# Patient Record
Sex: Male | Born: 1970
Health system: Southern US, Community
[De-identification: ages and names within clinical notes are randomized; demographics above are authoritative.]

---

## 2007-07-06 ENCOUNTER — Ambulatory Visit (HOSPITAL_COMMUNITY): Admission: RE | Admit: 2007-07-06 | Discharge: 2007-07-06 | Payer: Self-pay | Admitting: Family Medicine

## 2007-08-24 ENCOUNTER — Ambulatory Visit (HOSPITAL_COMMUNITY): Admission: RE | Admit: 2007-08-24 | Discharge: 2007-08-24 | Payer: Self-pay | Admitting: Family Medicine

## 2009-02-21 ENCOUNTER — Ambulatory Visit (HOSPITAL_COMMUNITY): Admission: RE | Admit: 2009-02-21 | Discharge: 2009-02-21 | Payer: Self-pay | Admitting: Family Medicine

## 2010-03-05 ENCOUNTER — Ambulatory Visit (HOSPITAL_COMMUNITY): Admission: RE | Admit: 2010-03-05 | Discharge: 2010-03-05 | Payer: Self-pay | Admitting: Family Medicine

## 2017-04-08 DIAGNOSIS — Z0000 Encounter for general adult medical examination without abnormal findings: Secondary | ICD-10-CM | POA: Diagnosis not present

## 2017-04-08 DIAGNOSIS — B356 Tinea cruris: Secondary | ICD-10-CM | POA: Diagnosis not present

## 2017-04-08 DIAGNOSIS — Z1389 Encounter for screening for other disorder: Secondary | ICD-10-CM | POA: Diagnosis not present

## 2018-02-08 DIAGNOSIS — G629 Polyneuropathy, unspecified: Secondary | ICD-10-CM | POA: Diagnosis not present

## 2018-02-08 DIAGNOSIS — Z1389 Encounter for screening for other disorder: Secondary | ICD-10-CM | POA: Diagnosis not present

## 2018-02-08 DIAGNOSIS — R202 Paresthesia of skin: Secondary | ICD-10-CM | POA: Diagnosis not present

## 2018-02-08 DIAGNOSIS — E6609 Other obesity due to excess calories: Secondary | ICD-10-CM | POA: Diagnosis not present

## 2018-02-08 DIAGNOSIS — M5481 Occipital neuralgia: Secondary | ICD-10-CM | POA: Diagnosis not present

## 2018-03-05 DIAGNOSIS — R202 Paresthesia of skin: Secondary | ICD-10-CM | POA: Diagnosis not present

## 2018-03-05 DIAGNOSIS — R0789 Other chest pain: Secondary | ICD-10-CM | POA: Diagnosis not present

## 2018-03-05 DIAGNOSIS — E6609 Other obesity due to excess calories: Secondary | ICD-10-CM | POA: Diagnosis not present

## 2018-03-24 ENCOUNTER — Ambulatory Visit (HOSPITAL_COMMUNITY)
Admission: RE | Admit: 2018-03-24 | Discharge: 2018-03-24 | Disposition: A | Discharge status: Home / Self Care | Payer: 59 | Source: Ambulatory Visit | Attending: Neurology | Admitting: Neurology

## 2018-03-24 ENCOUNTER — Other Ambulatory Visit (HOSPITAL_COMMUNITY): Payer: Self-pay | Admitting: Neurology

## 2018-03-24 DIAGNOSIS — M542 Cervicalgia: Secondary | ICD-10-CM | POA: Diagnosis present

## 2018-03-24 DIAGNOSIS — M2578 Osteophyte, vertebrae: Secondary | ICD-10-CM | POA: Insufficient documentation

## 2018-03-24 DIAGNOSIS — M50322 Other cervical disc degeneration at C5-C6 level: Secondary | ICD-10-CM | POA: Diagnosis not present

## 2018-03-24 DIAGNOSIS — G5603 Carpal tunnel syndrome, bilateral upper limbs: Secondary | ICD-10-CM | POA: Diagnosis not present

## 2018-03-24 DIAGNOSIS — G5622 Lesion of ulnar nerve, left upper limb: Secondary | ICD-10-CM | POA: Diagnosis not present

## 2018-04-01 ENCOUNTER — Other Ambulatory Visit (HOSPITAL_COMMUNITY): Payer: Self-pay | Admitting: Neurology

## 2018-04-01 DIAGNOSIS — M5412 Radiculopathy, cervical region: Secondary | ICD-10-CM | POA: Diagnosis not present

## 2018-04-01 DIAGNOSIS — G603 Idiopathic progressive neuropathy: Secondary | ICD-10-CM | POA: Diagnosis not present

## 2018-04-12 ENCOUNTER — Other Ambulatory Visit (HOSPITAL_COMMUNITY): Payer: Self-pay | Admitting: Neurology

## 2018-04-12 DIAGNOSIS — M5412 Radiculopathy, cervical region: Secondary | ICD-10-CM

## 2018-04-14 DIAGNOSIS — Z0000 Encounter for general adult medical examination without abnormal findings: Secondary | ICD-10-CM | POA: Diagnosis not present

## 2018-04-14 DIAGNOSIS — E6609 Other obesity due to excess calories: Secondary | ICD-10-CM | POA: Diagnosis not present

## 2018-04-14 DIAGNOSIS — Z1389 Encounter for screening for other disorder: Secondary | ICD-10-CM | POA: Diagnosis not present

## 2018-04-14 DIAGNOSIS — Z6831 Body mass index (BMI) 31.0-31.9, adult: Secondary | ICD-10-CM | POA: Diagnosis not present

## 2018-04-21 ENCOUNTER — Ambulatory Visit (HOSPITAL_COMMUNITY)
Admission: RE | Admit: 2018-04-21 | Discharge: 2018-04-21 | Disposition: A | Discharge status: Home / Self Care | Payer: 59 | Source: Ambulatory Visit | Attending: Neurology | Admitting: Neurology

## 2018-04-21 DIAGNOSIS — M47812 Spondylosis without myelopathy or radiculopathy, cervical region: Secondary | ICD-10-CM | POA: Diagnosis not present

## 2018-04-21 DIAGNOSIS — M50221 Other cervical disc displacement at C4-C5 level: Secondary | ICD-10-CM | POA: Diagnosis not present

## 2018-04-21 DIAGNOSIS — M5412 Radiculopathy, cervical region: Secondary | ICD-10-CM

## 2018-04-21 DIAGNOSIS — M542 Cervicalgia: Secondary | ICD-10-CM | POA: Diagnosis not present

## 2018-04-28 DIAGNOSIS — G5623 Lesion of ulnar nerve, bilateral upper limbs: Secondary | ICD-10-CM | POA: Diagnosis not present

## 2018-04-28 DIAGNOSIS — R208 Other disturbances of skin sensation: Secondary | ICD-10-CM | POA: Diagnosis not present

## 2018-04-28 DIAGNOSIS — M542 Cervicalgia: Secondary | ICD-10-CM | POA: Diagnosis not present

## 2018-10-29 DEATH — deceased

## 2018-12-19 IMAGING — DX DG CERVICAL SPINE COMPLETE 4+V
8 series · 8 of 8 positions shown · non-contrast
Comparison: No recent prior.

CLINICAL DATA: Neck pain.

EXAM:
CERVICAL SPINE - COMPLETE 4+ VIEW

[c-spine lat]
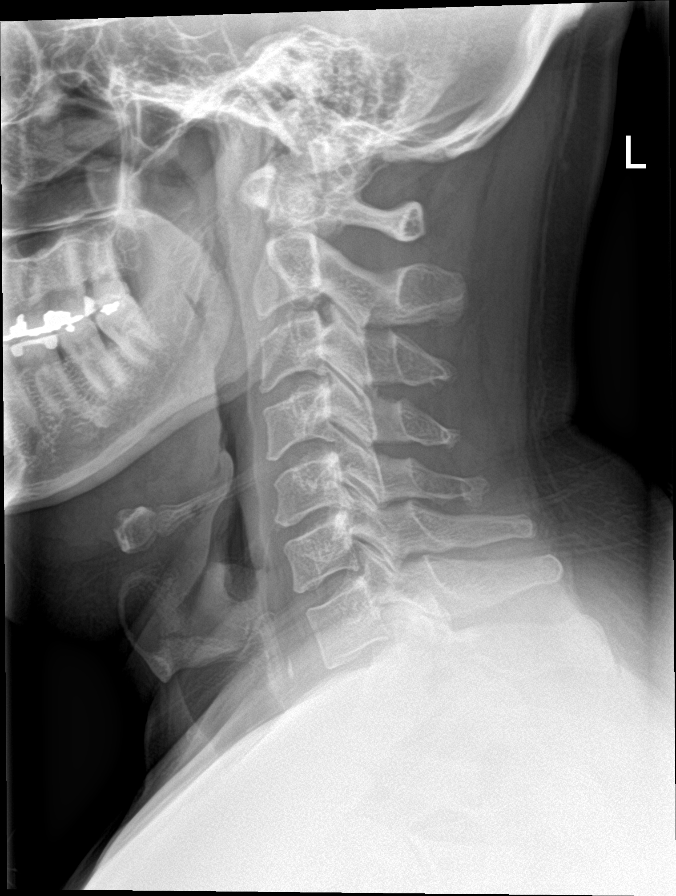

[c-spine obl (1 of 2)]
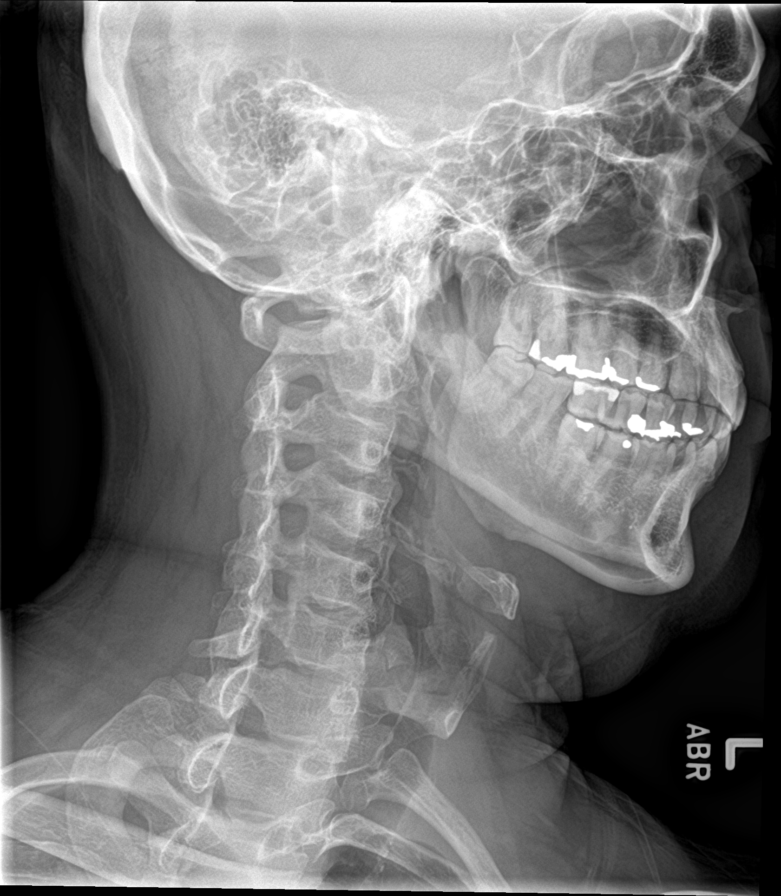

[c-spine obl (2 of 2)]
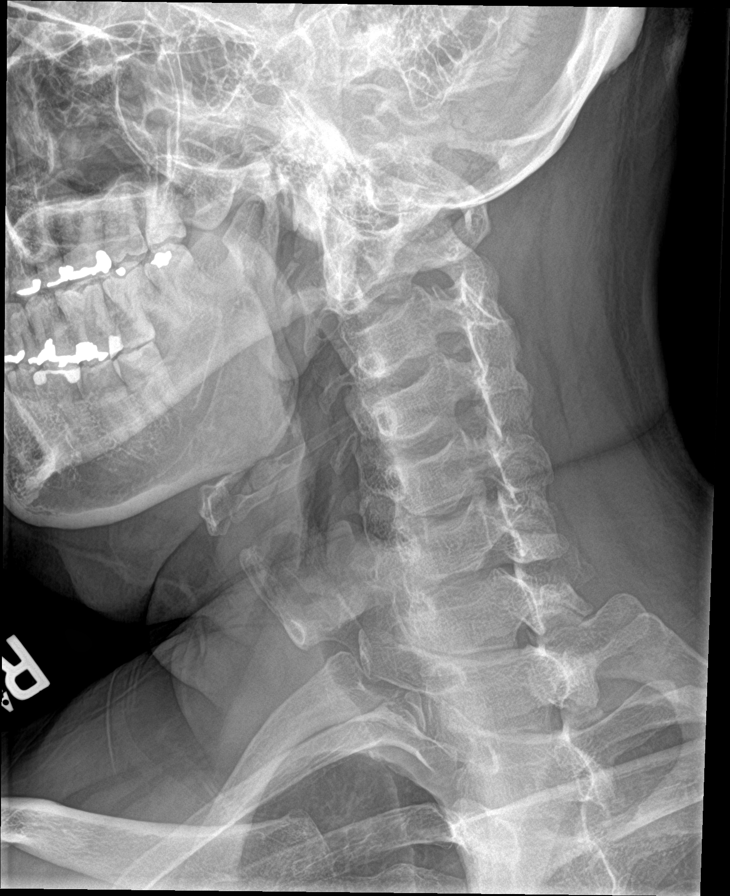

[c-spine ap (1 of 2)]
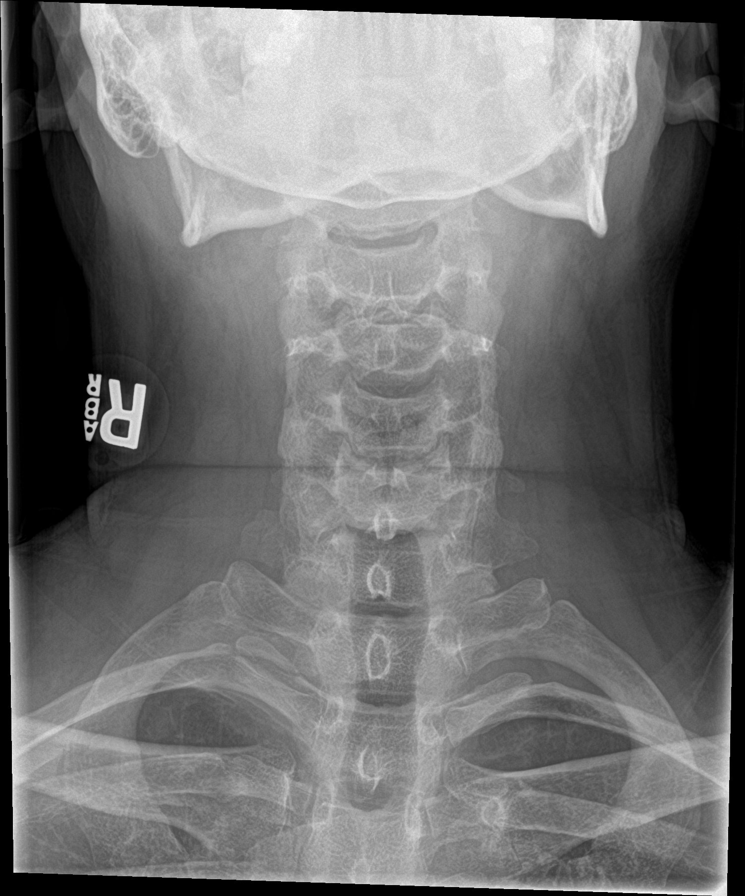

[c-spine open mouth (1 of 2)]
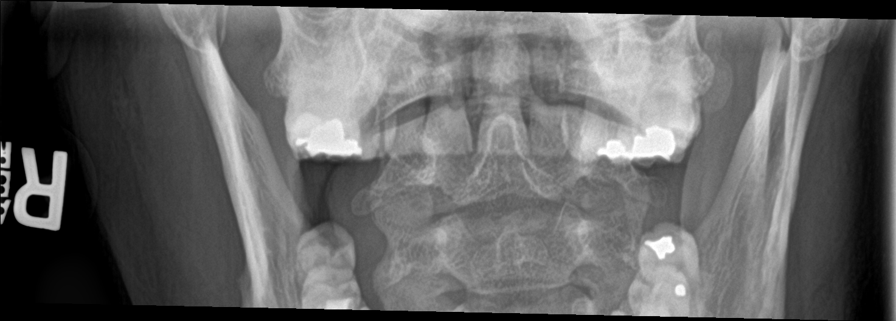

[c-spine swimmers trauma]
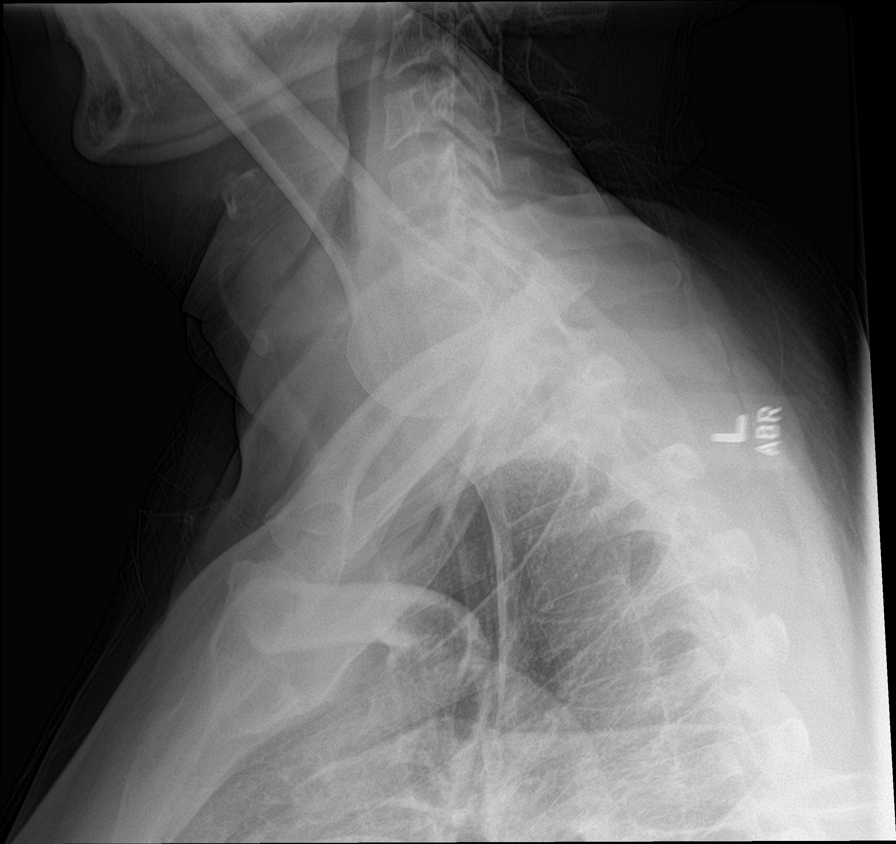

[c-spine open mouth (2 of 2)]
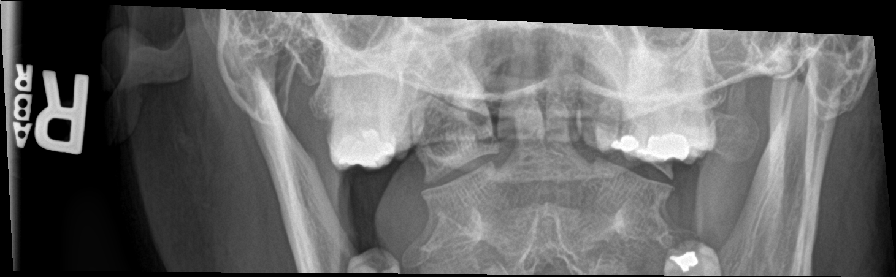

[c-spine ap (2 of 2)]
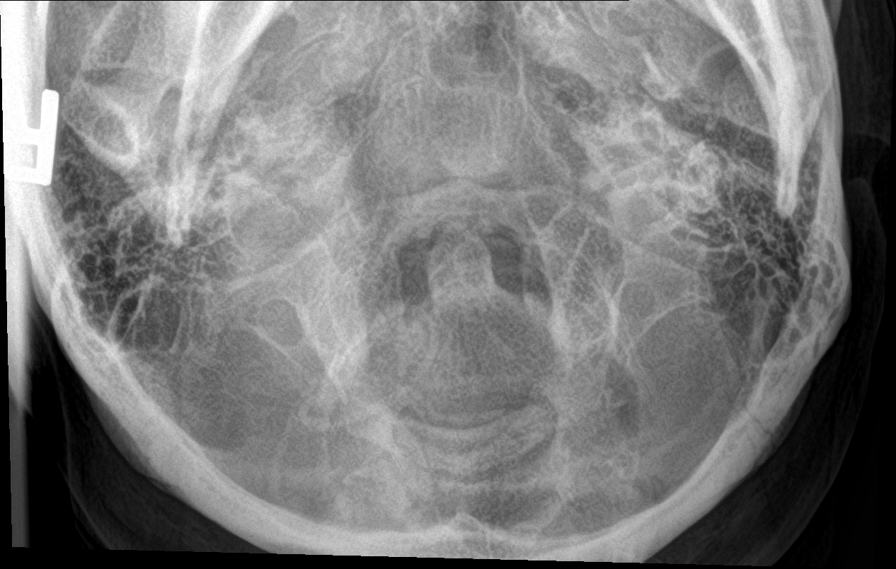

[8 of 8 positions shown; findings below may reference images not displayed]

FINDINGS: C5-C6 mild disc degeneration and endplate osteophyte formation. No
acute bony abnormality identified. No evidence of fracture or
dislocation. Mild bilateral neural foraminal narrowing noted at this
level.
IMPRESSION: C5-C6 mild disc degeneration and endplate osteophyte formation. Mild
bilateral neural foraminal narrowing noted at this level. No acute
abnormality identified.

## 2019-09-09 ENCOUNTER — Other Ambulatory Visit: Payer: Self-pay

## 2019-09-09 ENCOUNTER — Ambulatory Visit: Payer: Self-pay | Attending: Internal Medicine

## 2019-09-09 DIAGNOSIS — Z20822 Contact with and (suspected) exposure to covid-19: Secondary | ICD-10-CM | POA: Insufficient documentation

## 2019-09-10 LAB — NOVEL CORONAVIRUS, NAA: SARS-CoV-2, NAA: NOT DETECTED

## 2020-09-19 ENCOUNTER — Ambulatory Visit: Payer: Self-pay

## 2021-03-01 DIAGNOSIS — E6609 Other obesity due to excess calories: Secondary | ICD-10-CM | POA: Diagnosis not present

## 2021-03-01 DIAGNOSIS — Z6831 Body mass index (BMI) 31.0-31.9, adult: Secondary | ICD-10-CM | POA: Diagnosis not present

## 2021-03-01 DIAGNOSIS — Z0000 Encounter for general adult medical examination without abnormal findings: Secondary | ICD-10-CM | POA: Diagnosis not present

## 2021-03-06 DIAGNOSIS — Z6831 Body mass index (BMI) 31.0-31.9, adult: Secondary | ICD-10-CM | POA: Diagnosis not present

## 2021-03-06 DIAGNOSIS — E6609 Other obesity due to excess calories: Secondary | ICD-10-CM | POA: Diagnosis not present

## 2022-03-07 DIAGNOSIS — Z1331 Encounter for screening for depression: Secondary | ICD-10-CM | POA: Diagnosis not present

## 2022-03-07 DIAGNOSIS — E6609 Other obesity due to excess calories: Secondary | ICD-10-CM | POA: Diagnosis not present

## 2022-03-07 DIAGNOSIS — Z6831 Body mass index (BMI) 31.0-31.9, adult: Secondary | ICD-10-CM | POA: Diagnosis not present

## 2022-03-07 DIAGNOSIS — Z0001 Encounter for general adult medical examination with abnormal findings: Secondary | ICD-10-CM | POA: Diagnosis not present

## 2022-10-15 ENCOUNTER — Other Ambulatory Visit: Payer: Self-pay

## 2022-10-15 ENCOUNTER — Ambulatory Visit
Admission: EM | Admit: 2022-10-15 | Discharge: 2022-10-15 | Disposition: A | Discharge status: Home / Self Care | Payer: BC Managed Care – PPO | Attending: Nurse Practitioner | Admitting: Nurse Practitioner

## 2022-10-15 ENCOUNTER — Encounter: Payer: Self-pay | Admitting: Emergency Medicine

## 2022-10-15 DIAGNOSIS — J020 Streptococcal pharyngitis: Secondary | ICD-10-CM | POA: Diagnosis not present

## 2022-10-15 LAB — POCT INFLUENZA A/B
Influenza A, POC: NEGATIVE
Influenza B, POC: NEGATIVE

## 2022-10-15 LAB — POCT RAPID STREP A (OFFICE): Rapid Strep A Screen: POSITIVE — AB

## 2022-10-15 MED ORDER — AMOXICILLIN 500 MG PO CAPS: 500.0000 mg | ORAL_CAPSULE | Freq: Two times a day (BID) | ORAL | 0 refills | Status: AC

## 2022-10-15 NOTE — Discharge Instructions (Signed)
The rapid strep test was positive, the flu test was negative. Take medication as prescribed. Increase fluids and allow for plenty of rest. Recommend over-the-counter Tylenol or Ibuprofen every 8 hours as needed for pain, fever, or general discomfort. Warm salt water gargles 3-4 times daily to help with throat pain or discomfort. Recommend a diet with soft foods to include soups, broths, puddings, yogurt, Jell-O's, or popsicles until symptoms improve. Change toothbrush after 3 days. If symptoms do not improve with this treatment, please follow-up with your primary care physician for further evaluation. Follow-up as needed.

## 2022-10-15 NOTE — ED Triage Notes (Signed)
Pt reports fever, chills, headache, sore throat since yesterday.

## 2022-10-15 NOTE — ED Provider Notes (Signed)
[facility]    CSN: [id number] Arrival date & time: 10/15/22  0801      History   Chief Complaint Chief Complaint  Patient presents with   Fever    HPI Craig Burns is a 52 y.o. male.   The history is provided by the patient and the spouse.   The patient presents with his spouse for complaints of fever, chills, body aches, sore throat, headache, and decreased appetite.  Patient states symptoms started 1 day ago while he was at work.  Patient's spouse reports Tmax 103 earlier this morning.  Patient denies ear pain, ear drainage, cough, abdominal pain, nausea, vomiting, or diarrhea.  Patient states that he took Alka-Seltzer during the night.  He received the first "2 or 3" COVID vaccines.  Patient denies any obvious known sick contacts.  History reviewed. No pertinent past medical history.  There are no problems to display for this patient.   History reviewed. No pertinent surgical history.     Home Medications    Prior to Admission medications   Medication Sig Start Date End Date Taking? Authorizing Provider  amoxicillin (AMOXIL) 500 MG capsule Take 1 capsule (500 mg total) by mouth 2 (two) times daily for 10 days. 10/15/22 10/25/22 Yes Daje Stark-Warren, Sadie Haber, NP    Family History History reviewed. No pertinent family history.  Social History Social History   Tobacco Use   Smoking status: Former    Types: Cigarettes   Smokeless tobacco: Never  Substance Use Topics   Alcohol use: Never   Drug use: Never     Allergies   Patient has no allergy information on record.   Review of Systems Review of Systems Per HPI  Physical Exam Triage Vital Signs ED Triage Vitals  Enc Vitals Group     BP 10/15/22 0815 114/76     Pulse Rate 10/15/22 0815 (!) 101     Resp 10/15/22 0815 20     Temp 10/15/22 0815 (!) 100.5 F (38.1 C)     Temp Source 10/15/22 0815 Oral     SpO2 10/15/22 0815 94 %     Weight --      Height --      Head Circumference --       Peak Flow --      Pain Score 10/15/22 0813 5     Pain Loc --      Pain Edu? --      Excl. in GC? --    No data found.  Updated Vital Signs BP 114/76 (BP Location: Right Arm)   Pulse (!) 101   Temp (!) 100.5 F (38.1 C) (Oral)   Resp 20   SpO2 94%   Visual Acuity Right Eye Distance:   Left Eye Distance:   Bilateral Distance:    Right Eye Near:   Left Eye Near:    Bilateral Near:     Physical Exam Vitals and nursing note reviewed.  Constitutional:      General: He is not in acute distress.    Appearance: Normal appearance.  HENT:     Head: Normocephalic.     Right Ear: Tympanic membrane, ear canal and external ear normal.     Left Ear: Tympanic membrane, ear canal and external ear normal.     Nose: Congestion present.     Right Turbinates: Enlarged and swollen.     Left Turbinates: Enlarged and swollen.     Right Sinus: No maxillary sinus tenderness or  frontal sinus tenderness.     Left Sinus: No maxillary sinus tenderness or frontal sinus tenderness.     Mouth/Throat:     Lips: Pink.     Mouth: Mucous membranes are moist.     Pharynx: Uvula midline. Pharyngeal swelling and posterior oropharyngeal erythema present. No oropharyngeal exudate.     Tonsils: 1+ on the right. 1+ on the left.  Eyes:     Extraocular Movements: Extraocular movements intact.     Conjunctiva/sclera: Conjunctivae normal.     Pupils: Pupils are equal, round, and reactive to light.  Cardiovascular:     Rate and Rhythm: Regular rhythm. Tachycardia present.     Pulses: Normal pulses.     Heart sounds: Normal heart sounds.  Pulmonary:     Effort: Pulmonary effort is normal. No respiratory distress.     Breath sounds: Normal breath sounds. No stridor. No wheezing, rhonchi or rales.  Abdominal:     General: Bowel sounds are normal.     Palpations: Abdomen is soft.     Tenderness: There is no abdominal tenderness.  Musculoskeletal:     Cervical back: Normal range of motion.  Lymphadenopathy:      Cervical: No cervical adenopathy.  Skin:    General: Skin is warm and dry.  Neurological:     General: No focal deficit present.     Mental Status: He is alert and oriented to person, place, and time.  Psychiatric:        Mood and Affect: Mood normal.        Behavior: Behavior normal.      UC Treatments / Results  Labs (all labs ordered are listed, but only abnormal results are displayed) Labs Reviewed  POCT RAPID STREP A (OFFICE) - Abnormal; Notable for the following components:      Result Value   Rapid Strep A Screen Positive (*)    All other components within normal limits  POCT INFLUENZA A/B    EKG   Radiology No results found.  Procedures Procedures (including critical care time)  Medications Ordered in UC Medications - No data to display  Initial Impression / Assessment and Plan / UC Course  I have reviewed the triage vital signs and the nursing notes.  Pertinent labs & imaging results that were available during my care of the patient were reviewed by me and considered in my medical decision making (see chart for details).  The patient is well-appearing, he is in no acute distress, vital signs are stable.  Rapid strep test is positive, influenza test was negative.  Will treat with amoxicillin 500 mg twice daily for the next 10 days.  Supportive care recommendations were provided and discussed with the patient to include continuing ibuprofen or Tylenol for pain, fever, general discomfort, increasing fluids, and allowing for plenty of rest.  Patient was advised to discard his toothbrush after 3 days.  Patient is in agreement with this plan of care and verbalizes understanding.  All questions were answered.  Patient stable for discharge.   Final Clinical Impressions(s) / UC Diagnoses   Final diagnoses:  Streptococcal sore throat     Discharge Instructions      The rapid strep test was positive, the flu test was negative. Take medication as  prescribed. Increase fluids and allow for plenty of rest. Recommend over-the-counter Tylenol or Ibuprofen every 8 hours as needed for pain, fever, or general discomfort. Warm salt water gargles 3-4 times daily to help with throat pain or discomfort.  Recommend a diet with soft foods to include soups, broths, puddings, yogurt, Jell-O's, or popsicles until symptoms improve. Change toothbrush after 3 days. If symptoms do not improve with this treatment, please follow-up with your primary care physician for further evaluation. Follow-up as needed.     ED Prescriptions     Medication Sig Dispense Auth. Provider   amoxicillin (AMOXIL) 500 MG capsule Take 1 capsule (500 mg total) by mouth 2 (two) times daily for 10 days. 20 capsule Jakyla Reza-Warren, Sadie Haber, NP      PDMP not reviewed this encounter.   Abran Cantor, NP 10/15/22 [phone]

## 2023-01-06 ENCOUNTER — Other Ambulatory Visit: Payer: Self-pay

## 2023-01-06 ENCOUNTER — Ambulatory Visit
Admission: EM | Admit: 2023-01-06 | Discharge: 2023-01-06 | Disposition: A | Discharge status: Home / Self Care | Payer: BC Managed Care – PPO | Attending: Family Medicine | Admitting: Family Medicine

## 2023-01-06 ENCOUNTER — Encounter: Payer: Self-pay | Admitting: Emergency Medicine

## 2023-01-06 DIAGNOSIS — R030 Elevated blood-pressure reading, without diagnosis of hypertension: Secondary | ICD-10-CM

## 2023-01-06 DIAGNOSIS — R42 Dizziness and giddiness: Secondary | ICD-10-CM | POA: Diagnosis not present

## 2023-01-06 LAB — POCT FASTING CBG KUC MANUAL ENTRY: POCT Glucose (KUC): 116 mg/dL — AB (ref 70–99)

## 2023-01-06 MED ORDER — MECLIZINE HCL 25 MG PO TABS: 25.0000 mg | ORAL_TABLET | Freq: Three times a day (TID) | ORAL | 0 refills | Status: DC | PRN

## 2023-01-06 NOTE — ED Provider Notes (Addendum)
[facility]    CSN: [id number] Arrival date & time: 01/06/23  1108      History   Chief Complaint Chief Complaint  Patient presents with   Dizziness    HPI Craig Burns is a 52 y.o. male.   Patient presenting today with an episode of dizziness that occurred this morning around 6 AM while he was working.  He states he has had isolated episodes that are similar in the past and the common thread with all are that the dizzy spells, with standing up too quickly or turning his head too quickly, such as when driving or looking side-to-side.  He states directly after making these movements he feels the room spinning and feels off balance.  After a bit of rest symptoms subside with no other intervention.  Denies chest pain, shortness of breath, nausea, vomiting, diaphoresis, mental status changes, extremity weakness, head injury.  Feels he has been drinking enough fluids and keeping from getting too hot though he does work in a hot environment per patient.  No past history of chronic medical problems but when he went to see the nurse at work today his blood pressure was elevated to 170 over 90s    History reviewed. No pertinent past medical history.  There are no problems to display for this patient.   History reviewed. No pertinent surgical history.     Home Medications    Prior to Admission medications   Medication Sig Start Date End Date Taking? Authorizing Provider  meclizine (ANTIVERT) 25 MG tablet Take 1 tablet (25 mg total) by mouth 3 (three) times daily as needed for dizziness. 01/06/23  Yes Particia Nearing, PA-C    Family History History reviewed. No pertinent family history.  Social History Social History   Tobacco Use   Smoking status: Former    Types: Cigarettes   Smokeless tobacco: Never  Substance Use Topics   Alcohol use: Never   Drug use: Never     Allergies   Patient has no allergy information on record.   Review of Systems Review  of Systems Per HPI  Physical Exam Triage Vital Signs ED Triage Vitals  Enc Vitals Group     BP 01/06/23 1129 (!) 146/93     Pulse Rate 01/06/23 1129 68     Resp 01/06/23 1129 20     Temp 01/06/23 1129 98.2 F (36.8 C)     Temp Source 01/06/23 1129 Oral     SpO2 01/06/23 1129 97 %     Weight --      Height --      Head Circumference --      Peak Flow --      Pain Score 01/06/23 1128 0     Pain Loc --      Pain Edu? --      Excl. in GC? --    Orthostatic VS for the past 24 hrs:  BP- Lying Pulse- Lying BP- Sitting Pulse- Sitting BP- Standing at 0 minutes Pulse- Standing at 0 minutes  01/06/23 1143 (!) 151/91 66 138/90 69 (!) 144/93 69    Updated Vital Signs BP (!) 146/93 (BP Location: Right Arm)   Pulse 68   Temp 98.2 F (36.8 C) (Oral)   Resp 20   SpO2 97%   Visual Acuity Right Eye Distance:   Left Eye Distance:   Bilateral Distance:    Right Eye Near:   Left Eye Near:    Bilateral Near:  Physical Exam Vitals and nursing note reviewed.  Constitutional:      Appearance: Normal appearance.  HENT:     Head: Atraumatic.     Right Ear: Tympanic membrane normal.     Left Ear: Tympanic membrane normal.     Mouth/Throat:     Mouth: Mucous membranes are moist.  Eyes:     Extraocular Movements: Extraocular movements intact.     Conjunctiva/sclera: Conjunctivae normal.     Pupils: Pupils are equal, round, and reactive to light.  Cardiovascular:     Rate and Rhythm: Normal rate and regular rhythm.     Heart sounds: Normal heart sounds.  Pulmonary:     Effort: Pulmonary effort is normal.     Breath sounds: Normal breath sounds.  Musculoskeletal:        General: Normal range of motion.     Cervical back: Normal range of motion and neck supple.  Skin:    General: Skin is warm and dry.  Neurological:     General: No focal deficit present.     Mental Status: He is oriented to person, place, and time.     Cranial Nerves: No cranial nerve deficit.     Motor: No  weakness.     Gait: Gait normal.  Psychiatric:        Mood and Affect: Mood normal.        Thought Content: Thought content normal.        Judgment: Judgment normal.      UC Treatments / Results  Labs (all labs ordered are listed, but only abnormal results are displayed) Labs Reviewed  POCT FASTING CBG [facility] MANUAL ENTRY - Abnormal; Notable for the following components:      Result Value   POCT Glucose ([facility]) 116 (*)    All other components within normal limits    EKG   Radiology No results found.  Procedures Procedures (including critical care time)  Medications Ordered in UC Medications - No data to display  Initial Impression / Assessment and Plan / UC Course  I have reviewed the triage vital signs and the nursing notes.  Pertinent labs & imaging results that were available during my care of the patient were reviewed by me and considered in my medical decision making (see chart for details).     Mildly hypertensive today in triage, otherwise vital signs benign within normal limits.  His symptoms have dissipated at this point, but most consistent with positional vertigo.  His EKG today revealed normal sinus rhythm at 64 bpm without acute T wave or ST changes.  Did discuss with patient limitations of an EKG and that we are unable to fully rule out cardiac issues in the setting, and that he would need to go to the emergency department for further cardiac evaluation if desired.  His blood glucose at random was benign at 116 and was orthostatic vital signs were negative.  Discussed meclizine, Epley maneuvers, continue good hydration and to make an effort to avoid moving head quickly in any direction.  Did also discussed to follow-up with PCP for monitoring of his borderline blood pressures to see if this is consistent or episodic related to his dizzy spell this morning.  DASH diet, physical activity recommended.  Follow-up with PCP for recheck and go to the emergency department for  worsening symptoms.  Final Clinical Impressions(s) / UC Diagnoses   Final diagnoses:  Dizziness  Elevated blood pressure reading     Discharge Instructions  Your workup today is very reassuring.  I suspect a positional vertigo to be causing your dizzy spells.  I have provided you instructions on the Epley maneuver to help with this and some meclizine to take as needed for the dizzy spells.  Try not to move your head quickly in any direction to avoid bring on a spell.  Follow-up with your primary care provider regarding your dizzy spells and your borderline blood pressure readings.  Follow-up sooner for worsening symptoms.     ED Prescriptions     Medication Sig Dispense Auth. Provider   meclizine (ANTIVERT) 25 MG tablet Take 1 tablet (25 mg total) by mouth 3 (three) times daily as needed for dizziness. 30 tablet Particia Nearing, [place]      PDMP not reviewed this encounter.   Particia Nearing, [place] 01/06/23 1259    Roosvelt Maser [place], [place] 01/06/23 1300

## 2023-01-06 NOTE — ED Triage Notes (Signed)
Pt reports history of similar but reports worse today. Pt reports dizziness since 6am this am while working. Pt reports dizziness has subsided but reports generalized numbness/tingling that is now worse in bilateral hands. Pt reports "felt like I was walking uneven" but reports that has also improved.   Pt denies headache, vision changes. Pt alert and oriented. Gait steady. Airway patent. Speech clear.   Reports wears ear plugs at work and reports is a "hot environment".

## 2023-01-06 NOTE — Discharge Instructions (Signed)
Your workup today is very reassuring.  I suspect a positional vertigo to be causing your dizzy spells.  I have provided you instructions on the Epley maneuver to help with this and some meclizine to take as needed for the dizzy spells.  Try not to move your head quickly in any direction to avoid bring on a spell.  Follow-up with your primary care provider regarding your dizzy spells and your borderline blood pressure readings.  Follow-up sooner for worsening symptoms.

## 2023-01-16 DIAGNOSIS — M9901 Segmental and somatic dysfunction of cervical region: Secondary | ICD-10-CM | POA: Diagnosis not present

## 2023-01-16 DIAGNOSIS — H8110 Benign paroxysmal vertigo, unspecified ear: Secondary | ICD-10-CM | POA: Diagnosis not present

## 2023-02-27 DIAGNOSIS — E6609 Other obesity due to excess calories: Secondary | ICD-10-CM | POA: Diagnosis not present

## 2023-02-27 DIAGNOSIS — E782 Mixed hyperlipidemia: Secondary | ICD-10-CM | POA: Diagnosis not present

## 2023-02-27 DIAGNOSIS — Z6832 Body mass index (BMI) 32.0-32.9, adult: Secondary | ICD-10-CM | POA: Diagnosis not present

## 2023-02-27 DIAGNOSIS — Z024 Encounter for examination for driving license: Secondary | ICD-10-CM | POA: Diagnosis not present

## 2023-02-27 DIAGNOSIS — Z0001 Encounter for general adult medical examination with abnormal findings: Secondary | ICD-10-CM | POA: Diagnosis not present

## 2023-03-03 ENCOUNTER — Encounter (INDEPENDENT_AMBULATORY_CARE_PROVIDER_SITE_OTHER): Payer: Self-pay | Admitting: *Deleted

## 2023-05-29 ENCOUNTER — Telehealth: Payer: Self-pay

## 2023-05-29 ENCOUNTER — Ambulatory Visit: Payer: BC Managed Care – PPO

## 2023-05-29 ENCOUNTER — Encounter: Payer: Self-pay | Admitting: Emergency Medicine

## 2023-05-29 ENCOUNTER — Ambulatory Visit
Admission: EM | Admit: 2023-05-29 | Discharge: 2023-05-29 | Disposition: A | Discharge status: Home / Self Care | Payer: 59 | Attending: Family Medicine | Admitting: Family Medicine

## 2023-05-29 DIAGNOSIS — R051 Acute cough: Secondary | ICD-10-CM

## 2023-05-29 DIAGNOSIS — R509 Fever, unspecified: Secondary | ICD-10-CM | POA: Diagnosis not present

## 2023-05-29 DIAGNOSIS — J189 Pneumonia, unspecified organism: Secondary | ICD-10-CM | POA: Diagnosis not present

## 2023-05-29 LAB — POC COVID19/FLU A&B COMBO
Covid Antigen, POC: NEGATIVE
Influenza A Antigen, POC: NEGATIVE
Influenza B Antigen, POC: NEGATIVE

## 2023-05-29 MED ORDER — PROMETHAZINE-DM 6.25-15 MG/5ML PO SYRP: 5.0000 mL | ORAL_SOLUTION | Freq: Four times a day (QID) | ORAL | 0 refills | Status: AC | PRN

## 2023-05-29 MED ORDER — AZITHROMYCIN 250 MG PO TABS: ORAL_TABLET | ORAL | 0 refills | Status: AC

## 2023-05-29 MED ORDER — IBUPROFEN 800 MG PO TABS
800.0000 mg | ORAL_TABLET | Freq: Once | ORAL | Status: AC
  Administered 2023-05-29: 800 mg via ORAL

## 2023-05-29 NOTE — ED Triage Notes (Signed)
Fever, cough, body aches and headache since yesterday.   Had diarrhea 2 days ago.  Has been taking mucinex

## 2023-05-29 NOTE — Discharge Instructions (Signed)
COVID and flu test was negative today.  Your chest x-ray is pending and I will call when the results become available so we can talk about a treatment plan.  For now, continue over-the-counter fever reducers, drinking lots of fluids, over-the-counter cold and congestion medications.

## 2023-05-29 NOTE — Telephone Encounter (Signed)
Called patient and informed him that his x ray did show that pneumonia was present in his lungs. Per provider she wants to treat with antibiotics and cough syrup. Scripts sent. Pt verbalizes understanding.

## 2023-05-29 NOTE — ED Provider Notes (Signed)
[facility]    CSN: [id number] Arrival date & time: 05/29/23  1533      History   Chief Complaint No chief complaint on file.   HPI Craig Burns is a 52 y.o. male.   Patient presenting today with 4 to 5-day history of diarrhea, congestion and now 1 day history of fever, cough, body aches, headache.  Denies chest pain, shortness of breath, abdominal pain, rashes.  So far trying Mucinex with mild temporary benefit.  No known history of chronic pulmonary disease.  Multiple sick contacts at work recently.    History reviewed. No pertinent past medical history.  There are no problems to display for this patient.   History reviewed. No pertinent surgical history.     Home Medications    Prior to Admission medications   Medication Sig Start Date End Date Taking? Authorizing Provider  azithromycin (ZITHROMAX) 250 MG tablet Take first 2 tablets together, then 1 every day until finished. 05/29/23  Yes Particia Nearing, PA-C  promethazine-dextromethorphan (PROMETHAZINE-DM) 6.25-15 MG/5ML syrup Take 5 mLs by mouth 4 (four) times daily as needed. 05/29/23  Yes Particia Nearing, PA-C    Family History History reviewed. No pertinent family history.  Social History Social History   Tobacco Use   Smoking status: Former    Types: Cigarettes   Smokeless tobacco: Never  Vaping Use   Vaping status: Never Used  Substance Use Topics   Alcohol use: Never   Drug use: Never     Allergies   Patient has no known allergies.   Review of Systems Review of Systems Per HPI  Physical Exam Triage Vital Signs ED Triage Vitals  Encounter Vitals Group     BP 05/29/23 1548 116/67     Systolic BP Percentile --      Diastolic BP Percentile --      Pulse Rate 05/29/23 1548 (!) 121     Resp 05/29/23 1548 20     Temp 05/29/23 1548 (!) 102.1 F (38.9 C)     Temp Source 05/29/23 1548 Oral     SpO2 05/29/23 1548 92 %     Weight --      Height --      Head  Circumference --      Peak Flow --      Pain Score 05/29/23 1551 6     Pain Loc --      Pain Education --      Exclude from Growth Chart --    No data found.  Updated Vital Signs BP 116/67   Pulse (!) 121   Temp (!) 101.2 F (38.4 C) (Oral)   Resp 20   SpO2 92%   Visual Acuity Right Eye Distance:   Left Eye Distance:   Bilateral Distance:    Right Eye Near:   Left Eye Near:    Bilateral Near:     Physical Exam Vitals and nursing note reviewed.  Constitutional:      Appearance: He is well-developed.  HENT:     Head: Atraumatic.     Right Ear: External ear normal.     Left Ear: External ear normal.     Nose: Rhinorrhea present.     Mouth/Throat:     Pharynx: Posterior oropharyngeal erythema present. No oropharyngeal exudate.  Eyes:     Conjunctiva/sclera: Conjunctivae normal.     Pupils: Pupils are equal, round, and reactive to light.  Cardiovascular:     Rate and Rhythm:  Normal rate and regular rhythm.  Pulmonary:     Effort: Pulmonary effort is normal. No respiratory distress.     Breath sounds: Wheezing present. No rales.  Musculoskeletal:        General: Normal range of motion.     Cervical back: Normal range of motion and neck supple.  Lymphadenopathy:     Cervical: No cervical adenopathy.  Skin:    General: Skin is warm and dry.  Neurological:     Mental Status: He is alert and oriented to person, place, and time.  Psychiatric:        Behavior: Behavior normal.      UC Treatments / Results  Labs (all labs ordered are listed, but only abnormal results are displayed) Labs Reviewed  POC COVID19/FLU A&B COMBO    EKG   Radiology DG Chest 2 View  Result Date: 05/29/2023 CLINICAL DATA:  Cough, fever EXAM: CHEST - 2 VIEW COMPARISON:  03/05/2010 FINDINGS: The heart size and mediastinal contours are within normal limits. Streaky right perihilar opacity. Left lung is clear. No pleural effusion or pneumothorax. The visualized skeletal structures are  unremarkable. IMPRESSION: Streaky right perihilar opacity, suspicious for developing pneumonia. Electronically Signed   By: Duanne Guess D.O.   On: 05/29/2023 17:48    Procedures Procedures (including critical care time)  Medications Ordered in UC Medications  ibuprofen (ADVIL) tablet 800 mg (800 mg Oral Given 05/29/23 1554)    Initial Impression / Assessment and Plan / UC Course  I have reviewed the triage vital signs and the nursing notes.  Pertinent labs & imaging results that were available during my care of the patient were reviewed by me and considered in my medical decision making (see chart for details).     Febrile and tachycardic in triage, chest x-ray today showing right perihilar pneumonia.  Ibuprofen given in clinic for fever, will treat pneumonia with Zithromax, Phenergan DM, supportive over-the-counter medications and home care.  Return for worsening symptoms.  Final Clinical Impressions(s) / UC Diagnoses   Final diagnoses:  Fever, unspecified  Acute cough  Pneumonia of right lung due to infectious organism, unspecified part of lung     Discharge Instructions      COVID and flu test was negative today.  Your chest x-ray is pending and I will call when the results become available so we can talk about a treatment plan.  For now, continue over-the-counter fever reducers, drinking lots of fluids, over-the-counter cold and congestion medications.    ED Prescriptions     Medication Sig Dispense Auth. Provider   azithromycin (ZITHROMAX) 250 MG tablet Take first 2 tablets together, then 1 every day until finished. 6 tablet Particia Nearing, [place]   promethazine-dextromethorphan (PROMETHAZINE-DM) 6.25-15 MG/5ML syrup Take 5 mLs by mouth 4 (four) times daily as needed. 100 mL Particia Nearing, [place]      PDMP not reviewed this encounter.   Particia Nearing, [place] 05/29/23 1819

## 2023-09-01 ENCOUNTER — Encounter (INDEPENDENT_AMBULATORY_CARE_PROVIDER_SITE_OTHER): Payer: Self-pay | Admitting: *Deleted

## 2024-01-28 ENCOUNTER — Other Ambulatory Visit: Payer: Self-pay | Admitting: Family Medicine

## 2024-01-28 DIAGNOSIS — E785 Hyperlipidemia, unspecified: Secondary | ICD-10-CM

## 2024-03-03 ENCOUNTER — Ambulatory Visit
Admission: RE | Admit: 2024-03-03 | Discharge: 2024-03-03 | Disposition: A | Discharge status: Home / Self Care | Source: Ambulatory Visit | Attending: Family Medicine | Admitting: Family Medicine

## 2024-03-03 DIAGNOSIS — E785 Hyperlipidemia, unspecified: Secondary | ICD-10-CM
